# Patient Record
Sex: Female | Born: 1960 | Race: White | Hispanic: No | Marital: Single | Smoking: Never smoker
Health system: Southern US, Community
[De-identification: ages and names within clinical notes are randomized; demographics above are authoritative.]

## PROBLEM LIST (undated history)

## (undated) DIAGNOSIS — E039 Hypothyroidism, unspecified: Secondary | ICD-10-CM

## (undated) DIAGNOSIS — J45909 Unspecified asthma, uncomplicated: Secondary | ICD-10-CM

## (undated) DIAGNOSIS — K219 Gastro-esophageal reflux disease without esophagitis: Secondary | ICD-10-CM

## (undated) DIAGNOSIS — B019 Varicella without complication: Secondary | ICD-10-CM

## (undated) DIAGNOSIS — E079 Disorder of thyroid, unspecified: Secondary | ICD-10-CM

## (undated) DIAGNOSIS — E049 Nontoxic goiter, unspecified: Secondary | ICD-10-CM

## (undated) DIAGNOSIS — T7840XA Allergy, unspecified, initial encounter: Secondary | ICD-10-CM

## (undated) DIAGNOSIS — E119 Type 2 diabetes mellitus without complications: Secondary | ICD-10-CM

## (undated) DIAGNOSIS — G43909 Migraine, unspecified, not intractable, without status migrainosus: Secondary | ICD-10-CM

## (undated) DIAGNOSIS — D649 Anemia, unspecified: Secondary | ICD-10-CM

## (undated) DIAGNOSIS — R011 Cardiac murmur, unspecified: Secondary | ICD-10-CM

## (undated) DIAGNOSIS — K589 Irritable bowel syndrome without diarrhea: Secondary | ICD-10-CM

## (undated) DIAGNOSIS — I10 Essential (primary) hypertension: Secondary | ICD-10-CM

## (undated) HISTORY — PX: ENDOMETRIAL ABLATION: SHX621

## (undated) HISTORY — DX: Migraine, unspecified, not intractable, without status migrainosus: G43.909

## (undated) HISTORY — DX: Type 2 diabetes mellitus without complications: E11.9

## (undated) HISTORY — DX: Unspecified asthma, uncomplicated: J45.909

## (undated) HISTORY — PX: HAMMER TOE SURGERY: SHX385

## (undated) HISTORY — DX: Disorder of thyroid, unspecified: E07.9

## (undated) HISTORY — DX: Cardiac murmur, unspecified: R01.1

## (undated) HISTORY — DX: Gastro-esophageal reflux disease without esophagitis: K21.9

## (undated) HISTORY — DX: Allergy, unspecified, initial encounter: T78.40XA

## (undated) HISTORY — DX: Essential (primary) hypertension: I10

## (undated) HISTORY — DX: Varicella without complication: B01.9

---

## 2002-10-19 HISTORY — PX: SEPTOPLASTY: SUR1290

## 2005-04-28 ENCOUNTER — Ambulatory Visit: Payer: Self-pay | Admitting: Obstetrics and Gynecology

## 2005-12-04 ENCOUNTER — Emergency Department: Payer: Self-pay | Admitting: General Practice

## 2005-12-17 ENCOUNTER — Emergency Department: Payer: Self-pay | Admitting: Emergency Medicine

## 2006-06-09 ENCOUNTER — Emergency Department: Payer: Self-pay | Admitting: Unknown Physician Specialty

## 2006-09-01 ENCOUNTER — Ambulatory Visit: Payer: Self-pay | Admitting: Obstetrics and Gynecology

## 2006-10-13 ENCOUNTER — Ambulatory Visit: Payer: Self-pay | Admitting: Gastroenterology

## 2007-09-28 ENCOUNTER — Ambulatory Visit: Payer: Self-pay | Admitting: Obstetrics and Gynecology

## 2007-10-25 ENCOUNTER — Emergency Department: Payer: Self-pay | Admitting: Emergency Medicine
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
## 2009-11-04 ENCOUNTER — Ambulatory Visit: Payer: Self-pay | Admitting: Obstetrics and Gynecology

## 2010-11-05 ENCOUNTER — Ambulatory Visit: Payer: Self-pay | Admitting: Obstetrics and Gynecology

## 2011-03-25 ENCOUNTER — Ambulatory Visit: Payer: Self-pay | Admitting: Allergy

## 2011-11-09 ENCOUNTER — Ambulatory Visit: Payer: Self-pay | Admitting: Obstetrics and Gynecology

## 2012-02-03 DIAGNOSIS — E039 Hypothyroidism, unspecified: Secondary | ICD-10-CM | POA: Insufficient documentation

## 2012-08-30 ENCOUNTER — Emergency Department: Payer: Self-pay | Admitting: Emergency Medicine

## 2012-08-30 LAB — CBC
HCT: 38.3 % (ref 35.0–47.0)
HGB: 13.1 g/dL (ref 12.0–16.0)
MCH: 30.8 pg (ref 26.0–34.0)
MCHC: 34.1 g/dL (ref 32.0–36.0)
MCV: 90 fL (ref 80–100)
Platelet: 269 10*3/uL (ref 150–440)
RBC: 4.24 10*6/uL (ref 3.80–5.20)
WBC: 6.3 10*3/uL (ref 3.6–11.0)

## 2012-08-30 LAB — COMPREHENSIVE METABOLIC PANEL
Alkaline Phosphatase: 71 U/L (ref 50–136)
Anion Gap: 7 (ref 7–16)
Bilirubin,Total: 0.3 mg/dL (ref 0.2–1.0)
Calcium, Total: 8.7 mg/dL (ref 8.5–10.1)
Creatinine: 0.7 mg/dL (ref 0.60–1.30)
EGFR (African American): >60
Glucose: 80 mg/dL (ref 65–99)
Potassium: 3.8 mmol/L (ref 3.5–5.1)
SGPT (ALT): 20 U/L (ref 12–78)
Sodium: 140 mmol/L (ref 136–145)
Total Protein: 7.9 g/dL (ref 6.4–8.2)

## 2012-08-30 LAB — URINALYSIS, COMPLETE
Bilirubin,UR: NEGATIVE
Blood: NEGATIVE
Glucose,UR: NEGATIVE mg/dL (ref 0–75)
Leukocyte Esterase: NEGATIVE
Nitrite: NEGATIVE
Ph: 7 (ref 4.5–8.0)
RBC,UR: 2 /HPF (ref 0–5)
Specific Gravity: 1.004 (ref 1.003–1.030)
WBC UR: <1 /HPF (ref 0–5)

## 2012-11-09 ENCOUNTER — Ambulatory Visit: Payer: Self-pay | Admitting: Obstetrics and Gynecology

## 2013-11-10 ENCOUNTER — Ambulatory Visit: Payer: Self-pay | Admitting: Obstetrics and Gynecology

## 2014-07-19 ENCOUNTER — Emergency Department: Payer: Self-pay | Admitting: Emergency Medicine

## 2015-01-18 ENCOUNTER — Ambulatory Visit: Payer: Self-pay

## 2015-02-13 ENCOUNTER — Ambulatory Visit
Admit: 2015-02-13 | Disposition: A | Discharge status: Home / Self Care | Payer: Self-pay | Attending: Obstetrics and Gynecology | Admitting: Obstetrics and Gynecology

## 2016-02-07 ENCOUNTER — Ambulatory Visit: Payer: Self-pay | Admitting: Family Medicine

## 2016-02-10 ENCOUNTER — Ambulatory Visit (INDEPENDENT_AMBULATORY_CARE_PROVIDER_SITE_OTHER): Payer: BLUE CROSS/BLUE SHIELD | Admitting: Family Medicine

## 2016-02-10 ENCOUNTER — Encounter: Payer: Self-pay | Admitting: Family Medicine

## 2016-02-10 VITALS — BP 136/92 | HR 73 | Temp 98.0°F | Ht 67.0 in | Wt 209.8 lb

## 2016-02-10 DIAGNOSIS — R059 Cough, unspecified: Secondary | ICD-10-CM

## 2016-02-10 DIAGNOSIS — Z130 Encounter for screening for diseases of the blood and blood-forming organs and certain disorders involving the immune mechanism: Secondary | ICD-10-CM | POA: Diagnosis not present

## 2016-02-10 DIAGNOSIS — Z0001 Encounter for general adult medical examination with abnormal findings: Secondary | ICD-10-CM

## 2016-02-10 DIAGNOSIS — R6889 Other general symptoms and signs: Secondary | ICD-10-CM

## 2016-02-10 DIAGNOSIS — R05 Cough: Secondary | ICD-10-CM

## 2016-02-10 DIAGNOSIS — E669 Obesity, unspecified: Secondary | ICD-10-CM | POA: Diagnosis not present

## 2016-02-10 DIAGNOSIS — Z1211 Encounter for screening for malignant neoplasm of colon: Secondary | ICD-10-CM

## 2016-02-10 MED ORDER — FLUTICASONE-SALMETEROL 100-50 MCG/DOSE IN AEPB: 1.0000 | INHALATION_SPRAY | Freq: Two times a day (BID) | RESPIRATORY_TRACT | Status: DC

## 2016-02-10 NOTE — Progress Notes (Signed)
Pre visit review using our clinic review tool, if applicable. No additional management support is needed unless otherwise documented below in the visit note. 

## 2016-02-10 NOTE — Patient Instructions (Addendum)
Use the advair as directed.  We will call with your lab results and with the colonoscopy appt.  Follow up in 6 months.  Take care  Dr. Lacinda Axon   Health Maintenance, Female Adopting a healthy lifestyle and getting preventive care can go a long way to promote health and wellness. Talk with your health care provider about what schedule of regular examinations is right for you. This is a good chance for you to check in with your provider about disease prevention and staying healthy. In between checkups, there are plenty of things you can do on your own. Experts have done a lot of research about which lifestyle changes and preventive measures are most likely to keep you healthy. Ask your health care provider for more information. WEIGHT AND DIET  Eat a healthy diet  Be sure to include plenty of vegetables, fruits, low-fat dairy products, and lean protein.  Do not eat a lot of foods high in solid fats, added sugars, or salt.  Get regular exercise. This is one of the most important things you can do for your health.  Most adults should exercise for at least 150 minutes each week. The exercise should increase your heart rate and make you sweat (moderate-intensity exercise).  Most adults should also do strengthening exercises at least twice a week. This is in addition to the moderate-intensity exercise.  Maintain a healthy weight  Body mass index (BMI) is a measurement that can be used to identify possible weight problems. It estimates body fat based on height and weight. Your health care provider can help determine your BMI and help you achieve or maintain a healthy weight.  For females 19 years of age and older:   A BMI below 18.5 is considered underweight.  A BMI of 18.5 to 24.9 is normal.  A BMI of 25 to 29.9 is considered overweight.  A BMI of 30 and above is considered obese.  Watch levels of cholesterol and blood lipids  You should start having your blood tested for lipids and  cholesterol at 57 years of age, then have this test every 5 years.  You may need to have your cholesterol levels checked more often if:  Your lipid or cholesterol levels are high.  You are older than 57 years of age.  You are at high risk for heart disease.  CANCER SCREENING   Lung Cancer  Lung cancer screening is recommended for adults 55-38 years old who are at high risk for lung cancer because of a history of smoking.  A yearly low-dose CT scan of the lungs is recommended for people who:  Currently smoke.  Have quit within the past 15 years.  Have at least a 30-pack-year history of smoking. A pack year is smoking an average of one pack of cigarettes a day for 1 year.  Yearly screening should continue until it has been 15 years since you quit.  Yearly screening should stop if you develop a health problem that would prevent you from having lung cancer treatment.  Breast Cancer  Practice breast self-awareness. This means understanding how your breasts normally appear and feel.  It also means doing regular breast self-exams. Let your health care provider know about any changes, no matter how small.  If you are in your 20s or 30s, you should have a clinical breast exam (CBE) by a health care provider every 1-3 years as part of a regular health exam.  If you are 38 or older, have a CBE every  having a breast X-ray (mammogram) every year.  If you have a family history of breast cancer, talk to your health care provider about genetic screening.  If you are at high risk for breast cancer, talk to your health care provider about having an MRI and a mammogram every year.  Breast cancer gene (BRCA) assessment is recommended for women who have family members with BRCA-related cancers. BRCA-related cancers include:  Breast.  Ovarian.  Tubal.  Peritoneal cancers.  Results of the assessment will determine the need for genetic counseling and BRCA1 and BRCA2  testing. Cervical Cancer Your health care provider may recommend that you be screened regularly for cancer of the pelvic organs (ovaries, uterus, and vagina). This screening involves a pelvic examination, including checking for microscopic changes to the surface of your cervix (Pap test). You may be encouraged to have this screening done every 3 years, beginning at age 21.  For women ages 30-65, health care providers may recommend pelvic exams and Pap testing every 3 years, or they may recommend the Pap and pelvic exam, combined with testing for human papilloma virus (HPV), every 5 years. Some types of HPV increase your risk of cervical cancer. Testing for HPV may also be done on women of any age with unclear Pap test results.  Other health care providers may not recommend any screening for nonpregnant women who are considered low risk for pelvic cancer and who do not have symptoms. Ask your health care provider if a screening pelvic exam is right for you.  If you have had past treatment for cervical cancer or a condition that could lead to cancer, you need Pap tests and screening for cancer for at least 20 years after your treatment. If Pap tests have been discontinued, your risk factors (such as having a new sexual partner) need to be reassessed to determine if screening should resume. Some women have medical problems that increase the chance of getting cervical cancer. In these cases, your health care provider may recommend more frequent screening and Pap tests. Colorectal Cancer  This type of cancer can be detected and often prevented.  Routine colorectal cancer screening usually begins at 57 years of age and continues through 57 years of age.  Your health care provider may recommend screening at an earlier age if you have risk factors for colon cancer.  Your health care provider may also recommend using home test kits to check for hidden blood in the stool.  A small camera at the end of a  tube can be used to examine your colon directly (sigmoidoscopy or colonoscopy). This is done to check for the earliest forms of colorectal cancer.  Routine screening usually begins at age 50.  Direct examination of the colon should be repeated every 5-10 years through 57 years of age. However, you may need to be screened more often if early forms of precancerous polyps or small growths are found. Skin Cancer  Check your skin from head to toe regularly.  Tell your health care provider about any new moles or changes in moles, especially if there is a change in a mole's shape or color.  Also tell your health care provider if you have a mole that is larger than the size of a pencil eraser.  Always use sunscreen. Apply sunscreen liberally and repeatedly throughout the day.  Protect yourself by wearing long sleeves, pants, a wide-brimmed hat, and sunglasses whenever you are outside. HEART DISEASE, DIABETES, AND HIGH BLOOD PRESSURE   High blood   High blood pressure causes heart disease and increases the risk of stroke. High blood pressure is more likely to develop in:  People who have blood pressure in the high end of the normal range (130-139/85-89 mm Hg).  People who are overweight or obese.  People who are African American.  If you are 18-39 years of age, have your blood pressure checked every 3-5 years. If you are 40 years of age or older, have your blood pressure checked every year. You should have your blood pressure measured twice--once when you are at a hospital or clinic, and once when you are not at a hospital or clinic. Record the average of the two measurements. To check your blood pressure when you are not at a hospital or clinic, you can use:  An automated blood pressure machine at a pharmacy.  A home blood pressure monitor.  If you are between 55 years and 79 years old, ask your health care provider if you should take aspirin to prevent strokes.  Have regular diabetes screenings. This  involves taking a blood sample to check your fasting blood sugar level.  If you are at a normal weight and have a low risk for diabetes, have this test once every three years after 57 years of age.  If you are overweight and have a high risk for diabetes, consider being tested at a younger age or more often. PREVENTING INFECTION  Hepatitis B  If you have a higher risk for hepatitis B, you should be screened for this virus. You are considered at high risk for hepatitis B if:  You were born in a country where hepatitis B is common. Ask your health care provider which countries are considered high risk.  Your parents were born in a high-risk country, and you have not been immunized against hepatitis B (hepatitis B vaccine).  You have HIV or AIDS.  You use needles to inject street drugs.  You live with someone who has hepatitis B.  You have had sex with someone who has hepatitis B.  You get hemodialysis treatment.  You take certain medicines for conditions, including cancer, organ transplantation, and autoimmune conditions. Hepatitis C  Blood testing is recommended for:  Everyone born from 1945 through 1965.  Anyone with known risk factors for hepatitis C. Sexually transmitted infections (STIs)  You should be screened for sexually transmitted infections (STIs) including gonorrhea and chlamydia if:  You are sexually active and are younger than 57 years of age.  You are older than 57 years of age and your health care provider tells you that you are at risk for this type of infection.  Your sexual activity has changed since you were last screened and you are at an increased risk for chlamydia or gonorrhea. Ask your health care provider if you are at risk.  If you do not have HIV, but are at risk, it may be recommended that you take a prescription medicine daily to prevent HIV infection. This is called pre-exposure prophylaxis (PrEP). You are considered at risk if:  You are  sexually active and do not regularly use condoms or know the HIV status of your partner(s).  You take drugs by injection.  You are sexually active with a partner who has HIV. Talk with your health care provider about whether you are at high risk of being infected with HIV. If you choose to begin PrEP, you should first be tested for HIV. You should then be tested every 3 months for as   long as you are taking PrEP.  PREGNANCY   If you are premenopausal and you may become pregnant, ask your health care provider about preconception counseling.  If you may become pregnant, take 400 to 800 micrograms (mcg) of folic acid every day.  If you want to prevent pregnancy, talk to your health care provider about birth control (contraception). OSTEOPOROSIS AND MENOPAUSE   Osteoporosis is a disease in which the bones lose minerals and strength with aging. This can result in serious bone fractures. Your risk for osteoporosis can be identified using a bone density scan.  If you are 27 years of age or older, or if you are at risk for osteoporosis and fractures, ask your health care provider if you should be screened.  Ask your health care provider whether you should take a calcium or vitamin D supplement to lower your risk for osteoporosis.  Menopause may have certain physical symptoms and risks.  Hormone replacement therapy may reduce some of these symptoms and risks. Talk to your health care provider about whether hormone replacement therapy is right for you.  HOME CARE INSTRUCTIONS   Schedule regular health, dental, and eye exams.  Stay current with your immunizations.   Do not use any tobacco products including cigarettes, chewing tobacco, or electronic cigarettes.  If you are pregnant, do not drink alcohol.  If you are breastfeeding, limit how much and how often you drink alcohol.  Limit alcohol intake to no more than 1 drink per day for nonpregnant women. One drink equals 12 ounces of beer, 5  ounces of wine, or 1 ounces of hard liquor.  Do not use street drugs.  Do not share needles.  Ask your health care provider for help if you need support or information about quitting drugs.  Tell your health care provider if you often feel depressed.  Tell your health care provider if you have ever been abused or do not feel safe at home.   This information is not intended to replace advice given to you by your health care provider. Make sure you discuss any questions you have with your health care provider.   Document Released: 04/20/2011 Document Revised: 10/26/2014 Document Reviewed: 09/06/2013 Elsevier Interactive Patient Education [employer].

## 2016-02-11 DIAGNOSIS — Z0000 Encounter for general adult medical examination without abnormal findings: Secondary | ICD-10-CM | POA: Insufficient documentation

## 2016-02-11 DIAGNOSIS — R059 Cough, unspecified: Secondary | ICD-10-CM | POA: Insufficient documentation

## 2016-02-11 DIAGNOSIS — R05 Cough: Secondary | ICD-10-CM | POA: Insufficient documentation

## 2016-02-11 DIAGNOSIS — J302 Other seasonal allergic rhinitis: Secondary | ICD-10-CM | POA: Insufficient documentation

## 2016-02-11 DIAGNOSIS — J45909 Unspecified asthma, uncomplicated: Secondary | ICD-10-CM | POA: Insufficient documentation

## 2016-02-11 DIAGNOSIS — K219 Gastro-esophageal reflux disease without esophagitis: Secondary | ICD-10-CM | POA: Insufficient documentation

## 2016-02-11 DIAGNOSIS — G43909 Migraine, unspecified, not intractable, without status migrainosus: Secondary | ICD-10-CM | POA: Insufficient documentation

## 2016-02-11 LAB — LIPID PANEL
CHOL/HDL RATIO: 3
CHOLESTEROL: 209 mg/dL — AB (ref 0–200)
HDL: 73.5 mg/dL (ref >39.00)
LDL CALC: 117 mg/dL — AB (ref 0–99)
NONHDL: 135.18
Triglycerides: 89 mg/dL (ref 0.0–149.0)
VLDL: 17.8 mg/dL (ref 0.0–40.0)

## 2016-02-11 LAB — COMPREHENSIVE METABOLIC PANEL
ALBUMIN: 4.1 g/dL (ref 3.5–5.2)
ALT: 17 U/L (ref 0–35)
AST: 17 U/L (ref 0–37)
Alkaline Phosphatase: 56 U/L (ref 39–117)
BUN: 14 mg/dL (ref 6–23)
CHLORIDE: 104 meq/L (ref 96–112)
CO2: 27 mEq/L (ref 19–32)
CREATININE: 0.91 mg/dL (ref 0.40–1.20)
Calcium: 9.6 mg/dL (ref 8.4–10.5)
GFR: 67.82 mL/min (ref >60.00)
GLUCOSE: 89 mg/dL (ref 70–99)
POTASSIUM: 4.3 meq/L (ref 3.5–5.1)
Sodium: 140 mEq/L (ref 135–145)
Total Bilirubin: 0.4 mg/dL (ref 0.2–1.2)
Total Protein: 7.3 g/dL (ref 6.0–8.3)

## 2016-02-11 LAB — CBC
HEMATOCRIT: 36.7 % (ref 36.0–46.0)
HEMOGLOBIN: 12.4 g/dL (ref 12.0–15.0)
MCHC: 33.8 g/dL (ref 30.0–36.0)
MCV: 89.3 fl (ref 78.0–100.0)
Platelets: 264 10*3/uL (ref 150.0–400.0)
RBC: 4.11 Mil/uL (ref 3.87–5.11)
RDW: 12.9 % (ref 11.5–15.5)
WBC: 9.3 10*3/uL (ref 4.0–10.5)

## 2016-02-11 LAB — HEMOGLOBIN A1C: HEMOGLOBIN A1C: 7.4 % — AB (ref 4.6–6.5)

## 2016-02-11 MED ORDER — ALBUTEROL SULFATE HFA 108 (90 BASE) MCG/ACT IN AERS: 2.0000 | INHALATION_SPRAY | Freq: Four times a day (QID) | RESPIRATORY_TRACT | Status: AC | PRN

## 2016-02-11 NOTE — Assessment & Plan Note (Signed)
Pap smear up-to-date. Patient to get mammogram later this year. Tdap up today. Patient to consider pneumococcal and zostavax vaccines. Screening labs today.

## 2016-02-11 NOTE — Progress Notes (Addendum)
Subjective:  Patient ID: Robin Munoz, female    DOB: 11-09-1960  Age: 55 y.o. MRN: [id number]  CC: Establish care, cough  HPI Robin Munoz is a 55 y.o. female presents to the clinic today to establish care.  Patient also has complaints of cough.  Preventative Healthcare  Pap smear: 11/2014.  Mammogram: 12/2014. Due for mammogram.  Colonoscopy: In need of. Will place order.   Immunizations  Tetanus - 2015.   Pneumococcal - Discussed briefly today. Patient to consider.  Flu - N/A.  Zoster - discussed today. Patient to consider.  Labs: Labs today.  Exercise: No.  Alcohol use: See below.  Smoking/tobacco use: No.  Cough  Has been persistent since earlier this month.  He states that she been treated twice. The first time with doxycycline. In the second with prednisone/albuterol.  She reports her cough continues to persist.  She states it is intermittently productive.  Worse in the morning and at night.  Using albuterol frequently.  No known exacerbating or relieving factors.  No associated fevers or chills. She has had some shortness of breath.  Patient has a known history of asthma.  PMH, Surgical Hx, Family Hx, Social History reviewed and updated as below.  Past Medical History  Diagnosis Date  . Chicken pox   . Allergy   . Asthma   . Migraines   . Thyroid disease   . Hypertension   . Heart murmur   . GERD (gastroesophageal reflux disease)    Past Surgical History  Procedure Laterality Date  . Endometrial ablation    . Septoplasty  2004  . Hammer toe surgery     Family History  Problem Relation Age of Onset  . Heart disease Brother   . Hypertension Brother   . Colon cancer Maternal Aunt   . Colon cancer Cousin   . Heart disease Brother   . Stroke Maternal Aunt   . Diabetes Maternal Grandfather    Social History  Substance Use Topics  . Smoking status: Never Smoker   . Smokeless tobacco: Never Used  . Alcohol Use: 0.6 - 1.2  oz/week    1-2 Standard drinks or equivalent per week    Review of Systems  HENT: Positive for voice change.   Respiratory: Positive for cough and shortness of breath.   Neurological: Positive for headaches.  All other systems reviewed and are negative.  Objective:   Today's Vitals: BP 136/92 mmHg  Pulse 73  Temp(Src) 98 F (36.7 C) (Oral)  Ht  (1.702 m)  Wt 209 lb 12 oz (95.142 kg)  BMI 32.84 kg/m2  SpO2 97%  Physical Exam  Constitutional: She is oriented to person, place, and time. She appears well-developed and well-nourished. No distress.  HENT:  Head: Normocephalic and atraumatic.  Mouth/Throat: Oropharynx is clear and moist. No oropharyngeal exudate.  Normal TM's bilaterally.   Eyes: Conjunctivae are normal. No scleral icterus.  Neck: Neck supple. No thyromegaly present.  Cardiovascular: Normal rate and regular rhythm.   No murmur heard. Pulmonary/Chest: Effort normal and breath sounds normal. She has no wheezes. She has no rales.  Abdominal: Soft. She exhibits no distension. There is no tenderness. There is no rebound and no guarding.  Musculoskeletal: Normal range of motion. She exhibits no edema.  Lymphadenopathy:    She has no cervical adenopathy.  Neurological: She is alert and oriented to person, place, and time.  Skin: Skin is warm and dry. No rash noted.  Psychiatric: She has  a normal mood and affect.  Vitals reviewed.  Assessment & Plan:   Problem List Items Addressed This Visit    Cough    New problem.  Likely postviral vs secondary to asthma. Starting Advair.      Relevant Medications   albuterol (PROVENTIL HFA;VENTOLIN HFA) 108 (90 Base) MCG/ACT inhaler   Encounter for general adult medical examination with abnormal findings - Primary    Pap smear up-to-date. Patient to get mammogram later this year. Tdap up today. Patient to consider pneumococcal and zostavax vaccines. Screening labs today.      Relevant Orders   Ambulatory referral  to Gastroenterology    Other Visit Diagnoses    Screening for deficiency anemia        Relevant Orders    CBC (Completed)    Ambulatory referral to Gastroenterology    Obesity (BMI 30.0-34.9)        Relevant Orders    Comprehensive metabolic panel (Completed)    Lipid Profile (Completed)    HgB A1c (Completed)    Ambulatory referral to Gastroenterology    Encounter for screening colonoscopy        Relevant Orders    Ambulatory referral to Gastroenterology       Outpatient Encounter Prescriptions as of 02/10/2016  Medication Sig  . fexofenadine (ALLEGRA ALLERGY) 180 MG tablet Take by mouth.  . levothyroxine (SYNTHROID, LEVOTHROID) 112 MCG tablet Take 112 mcg by mouth daily. One tablet once a day  . albuterol (PROVENTIL HFA;VENTOLIN HFA) 108 (90 Base) MCG/ACT inhaler Inhale 2 puffs into the lungs every 6 (six) hours as needed for wheezing or shortness of breath.  . Fluticasone-Salmeterol (ADVAIR) 100-50 MCG/DOSE AEPB Inhale 1 puff into the lungs 2 (two) times daily.   No facility-administered encounter medications on file as of 02/10/2016.   Follow-up: 6 months.  Everlene OtherJayce Maria Gallicchio DO [facility] [place]

## 2016-02-11 NOTE — Assessment & Plan Note (Signed)
Pap smear up-to-date. Patient to get mammogram later this year. Tdap up today. Patient to consider pneumococcal and zostavax vaccines. Screening labs today. 

## 2016-02-11 NOTE — Assessment & Plan Note (Signed)
New problem.  Likely postviral vs secondary to asthma. Starting Advair.

## 2016-02-12 ENCOUNTER — Telehealth: Payer: Self-pay

## 2016-02-12 NOTE — Telephone Encounter (Signed)
Patient's insurance doesn't cover advair disku, please advise for change in med or prior authorization.  Thanks

## 2016-02-13 ENCOUNTER — Other Ambulatory Visit: Payer: Self-pay | Admitting: Family Medicine

## 2016-02-13 MED ORDER — BECLOMETHASONE DIPROPIONATE 80 MCG/ACT IN AERS: 1.0000 | INHALATION_SPRAY | Freq: Two times a day (BID) | RESPIRATORY_TRACT | Status: AC

## 2016-02-13 NOTE — Telephone Encounter (Signed)
Rx sent 

## 2016-02-14 ENCOUNTER — Ambulatory Visit (INDEPENDENT_AMBULATORY_CARE_PROVIDER_SITE_OTHER): Payer: BLUE CROSS/BLUE SHIELD | Admitting: Family Medicine

## 2016-02-14 ENCOUNTER — Encounter: Payer: Self-pay | Admitting: Family Medicine

## 2016-02-14 VITALS — BP 118/84 | HR 80 | Temp 98.3°F | Wt 208.5 lb

## 2016-02-14 DIAGNOSIS — E119 Type 2 diabetes mellitus without complications: Secondary | ICD-10-CM

## 2016-02-14 NOTE — Patient Instructions (Signed)
Follow up in 3 months.  We will call with the appt.  Take care  Dr. Adriana Simasook

## 2016-02-15 LAB — MICROALBUMIN / CREATININE URINE RATIO
Creatinine, Urine: 170 mg/dL (ref 20–320)
MICROALB/CREAT RATIO: 3 ug/mg{creat} (ref <30)
Microalb, Ur: 0.5 mg/dL

## 2016-02-16 DIAGNOSIS — E119 Type 2 diabetes mellitus without complications: Secondary | ICD-10-CM | POA: Insufficient documentation

## 2016-02-16 NOTE — Progress Notes (Signed)
   Subjective:  Patient ID: Robin Munoz, female    DOB: [birth year]  Age: 55 y.o. MRN: [id number]  CC: Follow up; new diagnosis of DM  HPI:  55 year old female presents to discuss new diagnosis of DM and management.  DM  New diagnosis.  Patient currently asymptomatic.  Discovered incidentally.   Patient presents to discuss treatment options. Preventative care  Eye exam - In need of later this year.  Foot exam - Will perform today.  Last A1C - Recent A1C was 7.4 confirming DM.  Urine microalbumin - Will obtain today.  Social Hx   Social History   Social History  . Marital Status: Single    Spouse Name: N/A  . Number of Children: N/A  . Years of Education: N/A   Social History Main Topics  . Smoking status: Never Smoker   . Smokeless tobacco: Never Used  . Alcohol Use: 0.6 - 1.2 oz/week    1-2 Standard drinks or equivalent per week  . Drug Use: No  . Sexual Activity:    Partners: Male   Other Topics Concern  . None   Social History Narrative   Review of Systems  Constitutional: Negative.   Endocrine: Negative.    Objective:  BP 118/84 mmHg  Pulse 80  Temp(Src) 98.3 F (36.8 C) (Oral)  Wt 208 lb 8 oz (94.575 kg)  SpO2 97%  BP/Weight 02/14/2016 02/10/2016  Systolic BP 118 136  Diastolic BP 84 92  Wt. (Lbs) 208.5 209.75  BMI 32.65 32.84   Physical Exam  Constitutional: She is oriented to person, place, and time. She appears well-developed. No distress.  Cardiovascular: Normal rate and regular rhythm.   Murmur heard. Pulmonary/Chest: Effort normal and breath sounds normal.  Neurological: She is alert and oriented to person, place, and time.  Psychiatric: She has a normal mood and affect.    Lab Results  Component Value Date   WBC 9.3 02/11/2016   HGB 12.4 02/11/2016   HCT 36.7 02/11/2016   PLT 264.0 02/11/2016   GLUCOSE 89 02/11/2016   CHOL 209* 02/11/2016   TRIG 89.0 02/11/2016   HDL 73.50 02/11/2016   LDLCALC 117* 02/11/2016   ALT  17 02/11/2016   AST 17 02/11/2016   NA 140 02/11/2016   K 4.3 02/11/2016   CL 104 02/11/2016   CREATININE 0.91 02/11/2016   BUN 14 02/11/2016   CO2 27 02/11/2016   HGBA1C 7.4* 02/11/2016   MICROALBUR 0.5 02/14/2016    Assessment & Plan:   Problem List Items Addressed This Visit    DM type 2 (diabetes mellitus, type 2) (HCC) - Primary    New diagnosis. Discussed treatment options today. Patient would like to try lifestyle changes prior to starting medication. Sending to diabetic education. Foot exam performed today. Urine microalbumin today. Advised to get eye exam.      Relevant Orders   Urine Microalbumin w/creat. ratio (Completed)     Follow-up: Return in about 3 months (around 05/15/2016).  20 minutes were spent face-to-face with the patient during this encounter and over half of that time was spent on counseling regarding new diagnosis of DM-2 as well as treatment options/approaches.   Everlene OtherJayce Eldoris Beiser DO [facility] [place]

## 2016-02-16 NOTE — Assessment & Plan Note (Addendum)
New diagnosis. Discussed treatment options today. Patient would like to try lifestyle changes prior to starting medication. Sending to diabetic education. Foot exam performed today. Urine microalbumin today. Advised to get eye exam.

## 2016-02-18 ENCOUNTER — Telehealth: Payer: Self-pay | Admitting: Family Medicine

## 2016-02-18 NOTE — Telephone Encounter (Signed)
Caller name: Robin Munoz Relationship to patient: patient Can be reached: [phone]  Reason for call: pt called requesting a referral to the [facility].

## 2016-02-20 ENCOUNTER — Encounter: Payer: BLUE CROSS/BLUE SHIELD | Attending: Family Medicine | Admitting: *Deleted

## 2016-02-20 ENCOUNTER — Encounter: Payer: Self-pay | Admitting: *Deleted

## 2016-02-20 ENCOUNTER — Other Ambulatory Visit: Payer: Self-pay | Admitting: Family Medicine

## 2016-02-20 VITALS — BP 124/80 | Ht 68.0 in | Wt 206.9 lb

## 2016-02-20 DIAGNOSIS — E119 Type 2 diabetes mellitus without complications: Secondary | ICD-10-CM

## 2016-02-20 NOTE — Patient Instructions (Addendum)
Check blood sugars 1 x day before breakfast or 2 hrs after supper every day Exercise: Begin walking for  15 minutes 3  days a week and gradually increase to 150 minutes/week Eat 3 meals day,  1-2  snacks a day Space meals 4-6 hours apart Bring blood sugar records to the next class Call your doctor for a prescription for:  1. Meter strips (type) One Touch Verio  checking  1   time per day  2. Lancets (type) One Touch Delica   checking  1   time per day

## 2016-02-21 NOTE — Progress Notes (Signed)
Diabetes Self-Management Education  Visit Type: First/Initial  Appt. Start Time: 0605 Appt. End Time: 1715  02/21/2016  Ms. Robin Munoz, identified by name and date of birth, is a 55 y.o. female with a diagnosis of Diabetes: Type 2.   ASSESSMENT  Blood pressure 124/80, height 5\' 8"  (1.727 m), weight 206 lb 14.4 oz (93.849 kg). Body mass index is 31.47 kg/(m^2).      Diabetes Self-Management Education - 02/20/16 1753    Visit Information   Visit Type First/Initial   Initial Visit   Diabetes Type Type 2   Are you currently following a meal plan? Yes   What type of meal plan do you follow? reduced carbohydrates   Are you taking your medications as prescribed? Yes   Date Diagnosed February 11, 2016   Health Coping   How would you rate your overall health? Fair   Psychosocial Assessment   Patient Belief/Attitude about Diabetes Motivated to manage diabetes  "worried"   Self-care barriers None   Self-management support Doctor's office   Patient Concerns Nutrition/Meal planning;Glycemic Control;Problem Solving;Weight Control;Healthy Lifestyle;Medication   Special Needs None   Preferred Learning Style Auditory;Visual;Hands on   Learning Readiness Change in progress   How often do you need to have someone help you when you read instructions, pamphlets, or other written materials from your doctor or pharmacy? 1 - Never   What is the last grade level you completed in school? BS   Pre-Education Assessment   Patient understands the diabetes disease and treatment process. Needs Instruction   Patient understands incorporating nutritional management into lifestyle. Needs Instruction   Patient undertands incorporating physical activity into lifestyle. Needs Instruction   Patient understands using medications safely. Needs Instruction   Patient understands monitoring blood glucose, interpreting and using results Needs Instruction   Patient understands prevention, detection, and treatment of  acute complications. Needs Instruction   Patient understands prevention, detection, and treatment of chronic complications. Needs Instruction   Patient understands how to develop strategies to address psychosocial issues. Needs Instruction   Patient understands how to develop strategies to promote health/change behavior. Needs Instruction   Complications   Last HgB A1C per patient/outside source 7.4 %  02/11/16   How often do you check your blood sugar? 0 times/day (not testing)  Provided One Touch Verio Flex meter and instructed on use. BG upon return demonstrationw as 77 mg/dL at 1:615:05 pm - 6 1/2 hrs pp.   Have you had a dilated eye exam in the past 12 months? Yes   Have you had a dental exam in the past 12 months? Yes   Are you checking your feet? No   Dietary Intake   Breakfast peanut butter crackers   Lunch foods from deli at work; [employer]   Snack (afternoon) [employer]   Dinner grilled meat and vegetables - salad, green beans   Snack (evening) popcorn   Beverage(s) water   Exercise   Exercise Type ADL's   Patient Education   Previous Diabetes Education No   Disease state  Definition of diabetes, type 1 and 2, and the diagnosis of diabetes   Nutrition management  Role of diet in the treatment of diabetes and the relationship between the three main macronutrients and blood glucose level   Physical activity and exercise  Role of exercise on diabetes management, blood pressure control and cardiac health.   Monitoring Taught/evaluated SMBG meter.;Purpose and frequency of SMBG.;Identified appropriate SMBG and/or A1C goals.   Chronic complications Relationship between chronic  complications and blood glucose control   Psychosocial adjustment Identified and addressed patients feelings and concerns about diabetes   Individualized Goals (developed by patient)   Reducing Risk Improve blood sugars Prevent diabetes complications Lose weight Lead a healthier lifestyle Become more fit Prevent taking  diabetes medications for now   Outcomes   Expected Outcomes Demonstrated interest in learning. Expect positive outcomes      Individualized Plan for Diabetes Self-Management Training:   Learning Objective:  Patient will have a greater understanding of diabetes self-management. Patient education plan is to attend individual and/or group sessions per assessed needs and concerns.   Plan:   Patient Instructions  Check blood sugars 1 x day before breakfast or 2 hrs after supper every day Exercise: Begin walking for  15 minutes 3  days a week and gradually increase to 150 minutes/week Eat 3 meals day,  1-2  snacks a day Space meals 4-6 hours apart Bring blood sugar records to the next class Call your doctor for a prescription for:  1. Meter strips (type) One Touch Verio  checking  1   time per day  2. Lancets (type) One Touch Delica   checking  1   time per day   Expected Outcomes:  Demonstrated interest in learning. Expect positive outcomes  Education material provided:  General Meal Planning Guidelines Simple Meal Plan Meter - One Touch Verio Flex  If problems or questions, patient to contact team via:   Sharion Settler, RN, CCM, CDE [phone]  Future DSME appointment:  Monday Feb 24, 2016 for Diabetes Class 1

## 2016-02-24 ENCOUNTER — Encounter: Payer: BLUE CROSS/BLUE SHIELD | Admitting: Dietician

## 2016-02-24 ENCOUNTER — Encounter: Payer: Self-pay | Admitting: Dietician

## 2016-02-24 VITALS — Ht 67.0 in | Wt 204.3 lb

## 2016-02-24 DIAGNOSIS — E119 Type 2 diabetes mellitus without complications: Secondary | ICD-10-CM | POA: Diagnosis not present

## 2016-02-24 NOTE — Progress Notes (Signed)

## 2016-03-02 ENCOUNTER — Encounter: Payer: Self-pay | Admitting: Dietician

## 2016-03-02 ENCOUNTER — Encounter: Payer: BLUE CROSS/BLUE SHIELD | Admitting: Dietician

## 2016-03-02 VITALS — Wt 202.2 lb

## 2016-03-02 DIAGNOSIS — E119 Type 2 diabetes mellitus without complications: Secondary | ICD-10-CM | POA: Diagnosis not present

## 2016-03-02 NOTE — Progress Notes (Signed)

## 2016-03-06 ENCOUNTER — Other Ambulatory Visit: Payer: Self-pay

## 2016-03-06 MED ORDER — ONETOUCH LANCETS MISC: Status: AC

## 2016-03-06 MED ORDER — GLUCOSE BLOOD VI STRP: ORAL_STRIP | Status: AC

## 2016-03-09 ENCOUNTER — Encounter: Payer: Self-pay | Admitting: Dietician

## 2016-03-09 ENCOUNTER — Ambulatory Visit: Payer: Self-pay | Admitting: Family Medicine

## 2016-03-09 ENCOUNTER — Encounter: Payer: BLUE CROSS/BLUE SHIELD | Admitting: Dietician

## 2016-03-09 VITALS — BP 129/80 | Ht 67.0 in | Wt 203.0 lb

## 2016-03-09 DIAGNOSIS — E119 Type 2 diabetes mellitus without complications: Secondary | ICD-10-CM | POA: Diagnosis not present

## 2016-03-09 NOTE — Progress Notes (Signed)
Appt. Start Time: 1730Appt. End Time: 1030  Class 3 Psychosocial - identify DM as a source of stress; state the effects of stress on BG control; verbalize appropriate stress management techniques; identify personal stress issues   Nutritional Management - describe effects of food on blood glucose; identify sources of carbohydrate, protein and fat; verbalize the importance of balance meals in controlling blood glucose; identify meals as well balanced or not; estimate servings of carbohydrate from menus; use food labels to identify servings size, content of carbohydrate, fiber, protein, fat, saturated fat and sodium; recognize food sources of fat, saturated fat, trans fat, sodium and verbalize goals for intake; describe healthful appropriate food choices when dining out   Exercise - describe the effects of exercise on blood glucose and importance of regular exercise in controlling diabetes; state a plan for personal exercise; verbalize contraindications for exercise  Self-Monitoring - state importance of HBGM and demo procedure accurately; use HBGM results to effectively manage diabetes; identify importance of regular HbA1C testing and goals for results  Acute Complications/Sick Day Guidelines - recognize hyperglycemia and hypoglycemia with causes and effects; identify blood glucose results as high, low or in control; list steps in treating and preventing high and low blood glucose; state appropriate measure to manage blood glucose when ill (need for meds, HBGM plan, when to call physician, need for fluids)  Chronic Complications/Foot, Skin, Eye Dental Care - identify possible long-term complications of diabetes (retinopathy, neuropathy, nephropathy, cardiovascular disease, infections); explain steps in prevention and treatment of chronic complications; state importance of daily self-foot exams; describe how to examine feet and what to look for; explain appropriate eye and dental care  Lifestyle  Changes/Goals & Health/Community Resources - state benefits of making appropriate lifestyle changes; identify habits that need to change (meals, tobacco, alcohol); identify strategies to reduce risk factors for personal health; set goals for proper diabetes care; state need for and frequency of healthcare follow-up; describe appropriate community resources for good health (ADA, web sites, apps)   Teaching Materials Used: Class 3 Slide Packet Diabetes Stress Test Stress Management Tools Stress Poem Recipe/Menu Booklet Nutrition Prescription Fast Food Information Goal Setting Worksheet    

## 2016-03-17 ENCOUNTER — Encounter: Payer: Self-pay | Admitting: *Deleted

## 2016-04-17 ENCOUNTER — Encounter: Payer: Self-pay | Admitting: Family Medicine

## 2016-04-17 ENCOUNTER — Other Ambulatory Visit: Payer: Self-pay | Admitting: Obstetrics and Gynecology

## 2016-04-17 DIAGNOSIS — Z1231 Encounter for screening mammogram for malignant neoplasm of breast: Secondary | ICD-10-CM

## 2016-05-04 ENCOUNTER — Ambulatory Visit
Admission: RE | Admit: 2016-05-04 | Discharge: 2016-05-04 | Disposition: A | Discharge status: Home / Self Care | Payer: BLUE CROSS/BLUE SHIELD | Source: Ambulatory Visit | Attending: Obstetrics and Gynecology | Admitting: Obstetrics and Gynecology

## 2016-05-04 ENCOUNTER — Other Ambulatory Visit: Payer: Self-pay | Admitting: Obstetrics and Gynecology

## 2016-05-04 DIAGNOSIS — Z1231 Encounter for screening mammogram for malignant neoplasm of breast: Secondary | ICD-10-CM

## 2016-05-19 ENCOUNTER — Encounter: Payer: Self-pay | Admitting: Family Medicine

## 2016-05-19 ENCOUNTER — Ambulatory Visit (INDEPENDENT_AMBULATORY_CARE_PROVIDER_SITE_OTHER): Payer: BLUE CROSS/BLUE SHIELD | Admitting: Family Medicine

## 2016-05-19 VITALS — BP 144/92 | HR 70 | Temp 97.9°F | Wt 198.0 lb

## 2016-05-19 DIAGNOSIS — B353 Tinea pedis: Secondary | ICD-10-CM | POA: Diagnosis not present

## 2016-05-19 DIAGNOSIS — E119 Type 2 diabetes mellitus without complications: Secondary | ICD-10-CM | POA: Diagnosis not present

## 2016-05-19 MED ORDER — CICLOPIROX OLAMINE 0.77 % EX CREA: TOPICAL_CREAM | Freq: Two times a day (BID) | CUTANEOUS | 0 refills | Status: AC

## 2016-05-19 NOTE — Progress Notes (Signed)
Subjective:  Patient ID: Robin Munoz, female    DOB: December 11, 1960  Age: 55 y.o. MRN: [id number]  CC: Follow up  HPI:  55 year old female with DM-2 , Asthma, Hypothyroidism presents for follow up regarding her DM-2. She also has an acute issue as well (see below).  DM  Blood sugars readings - Max 108-110; Has made dietary changes.  Medications - None at this time. Patient wanted trial of lifestyle changes.  Preventative care  Eye exam - In need of later this year.   Foot exam - Up to date.  Last A1C - A1C today.  Urine microalbumin - Up to date.  Concern for Athlete's foot  Patient reports some skin changes/peeling between the 3rd and 4th toes of her left foot.  This is been going on for quite some time.  She's been applying over-the-counter topical antifungal spray with some improvement but no resolution.  Exacerbated by moisture.  No other associated symptoms.  No other complaints this time.  Social Hx   Social History   Social History  . Marital status: Single    Spouse name: N/A  . Number of children: N/A  . Years of education: N/A   Social History Main Topics  . Smoking status: Never Smoker  . Smokeless tobacco: Never Used  . Alcohol use 0.6 oz/week    1 Glasses of wine per week  . Drug use: No  . Sexual activity: Yes    Partners: Male   Other Topics Concern  . None   Social History Narrative  . None   Review of Systems  Constitutional: Negative.   Endocrine: Negative.   Skin:       Athletes foot.   Objective:  BP (!) 144/92 (BP Location: Right Arm, Patient Position: Sitting, Cuff Size: Normal)   Pulse 70   Temp 97.9 F (36.6 C) (Oral)   Wt 198 lb (89.8 kg)   SpO2 98%   BMI 31.01 kg/m   BP/Weight 05/19/2016 03/09/2016 03/02/2016  Systolic BP 144 129 -  Diastolic BP 92 80 -  Wt. (Lbs) 198 203 202.2  BMI 31.01 31.79 31.66   Physical Exam  Constitutional: She is oriented to person, place, and time. She appears well-developed. No  distress.  Pulmonary/Chest: Effort normal.  Neurological: She is alert and oriented to person, place, and time.  Skin:  Left foot - Interdigital region (3rd and 4th toes) with peeling/maceration.   Psychiatric: She has a normal mood and affect.  Vitals reviewed.  Lab Results  Component Value Date   WBC 9.3 02/11/2016   HGB 12.4 02/11/2016   HCT 36.7 02/11/2016   PLT 264.0 02/11/2016   GLUCOSE 89 02/11/2016   CHOL 209 (H) 02/11/2016   TRIG 89.0 02/11/2016   HDL 73.50 02/11/2016   LDLCALC 117 (H) 02/11/2016   ALT 17 02/11/2016   AST 17 02/11/2016   NA 140 02/11/2016   K 4.3 02/11/2016   CL 104 02/11/2016   CREATININE 0.91 02/11/2016   BUN 14 02/11/2016   CO2 27 02/11/2016   HGBA1C 7.4 (H) 02/11/2016   MICROALBUR 0.5 02/14/2016    Assessment & Plan:   Problem List Items Addressed This Visit    DM type 2 (diabetes mellitus, type 2) (HCC) - Primary    Established problem, improving. A1C today.      Relevant Orders   HgB A1c   Tinea pedis    New problem. Failed OTC treatment. Treating with Loprox.  Relevant Medications   ciclopirox (LOPROX) 0.77 % cream    Other Visit Diagnoses   None.     Meds ordered this encounter  Medications  . ciclopirox (LOPROX) 0.77 % cream    Sig: Apply topically 2 (two) times daily.    Dispense:  90 g    Refill:  0    Follow-up: TBD  Robin Munoz Other DO [facility]

## 2016-05-19 NOTE — Progress Notes (Signed)
Pre visit review using our clinic review tool, if applicable. No additional management support is needed unless otherwise documented below in the visit note. 

## 2016-05-19 NOTE — Assessment & Plan Note (Signed)
New problem. Failed OTC treatment. Treating with Loprox.

## 2016-05-19 NOTE — Assessment & Plan Note (Signed)
Established problem, improving. A1C today.

## 2016-05-19 NOTE — Patient Instructions (Signed)
We will call with your lab results.  Follow up to be determine based on A1C.  Take care  Dr. Adriana Simas

## 2016-05-20 LAB — HEMOGLOBIN A1C: HEMOGLOBIN A1C: 6.9 % — AB (ref 4.6–6.5)

## 2017-04-23 ENCOUNTER — Other Ambulatory Visit: Payer: Self-pay | Admitting: Obstetrics and Gynecology

## 2017-04-23 DIAGNOSIS — Z1231 Encounter for screening mammogram for malignant neoplasm of breast: Secondary | ICD-10-CM

## 2017-05-18 ENCOUNTER — Ambulatory Visit
Admission: RE | Admit: 2017-05-18 | Discharge: 2017-05-18 | Disposition: A | Discharge status: Home / Self Care | Payer: BLUE CROSS/BLUE SHIELD | Source: Ambulatory Visit | Attending: Obstetrics and Gynecology | Admitting: Obstetrics and Gynecology

## 2017-05-18 DIAGNOSIS — Z1231 Encounter for screening mammogram for malignant neoplasm of breast: Secondary | ICD-10-CM | POA: Insufficient documentation

## 2018-04-28 ENCOUNTER — Other Ambulatory Visit: Payer: Self-pay | Admitting: Obstetrics and Gynecology

## 2018-04-28 DIAGNOSIS — Z1231 Encounter for screening mammogram for malignant neoplasm of breast: Secondary | ICD-10-CM

## 2018-06-07 ENCOUNTER — Ambulatory Visit
Admission: RE | Admit: 2018-06-07 | Discharge: 2018-06-07 | Disposition: A | Discharge status: Home / Self Care | Payer: BLUE CROSS/BLUE SHIELD | Source: Ambulatory Visit | Attending: Obstetrics and Gynecology | Admitting: Obstetrics and Gynecology

## 2018-06-07 DIAGNOSIS — Z1231 Encounter for screening mammogram for malignant neoplasm of breast: Secondary | ICD-10-CM

## 2018-11-18 ENCOUNTER — Ambulatory Visit: Payer: Self-pay | Admitting: Urology

## 2019-06-21 ENCOUNTER — Other Ambulatory Visit: Payer: Self-pay | Admitting: Obstetrics and Gynecology

## 2019-06-21 DIAGNOSIS — Z1231 Encounter for screening mammogram for malignant neoplasm of breast: Secondary | ICD-10-CM

## 2019-07-26 ENCOUNTER — Ambulatory Visit
Admission: RE | Admit: 2019-07-26 | Discharge: 2019-07-26 | Disposition: A | Discharge status: Home / Self Care | Payer: BC Managed Care – PPO | Source: Ambulatory Visit | Attending: Obstetrics and Gynecology | Admitting: Obstetrics and Gynecology

## 2019-07-26 DIAGNOSIS — Z1231 Encounter for screening mammogram for malignant neoplasm of breast: Secondary | ICD-10-CM | POA: Diagnosis present

## 2020-07-11 ENCOUNTER — Other Ambulatory Visit: Payer: Self-pay | Admitting: Obstetrics and Gynecology

## 2020-07-11 DIAGNOSIS — Z1231 Encounter for screening mammogram for malignant neoplasm of breast: Secondary | ICD-10-CM

## 2020-08-15 ENCOUNTER — Ambulatory Visit
Admission: RE | Admit: 2020-08-15 | Discharge: 2020-08-15 | Disposition: A | Discharge status: Home / Self Care | Payer: BC Managed Care – PPO | Source: Ambulatory Visit | Attending: Obstetrics and Gynecology | Admitting: Obstetrics and Gynecology

## 2020-08-15 ENCOUNTER — Other Ambulatory Visit: Payer: Self-pay

## 2020-08-15 DIAGNOSIS — Z1231 Encounter for screening mammogram for malignant neoplasm of breast: Secondary | ICD-10-CM

## 2020-10-19 HISTORY — PX: OTHER SURGICAL HISTORY: SHX169

## 2021-07-17 ENCOUNTER — Other Ambulatory Visit: Payer: Self-pay | Admitting: Obstetrics and Gynecology

## 2021-07-17 DIAGNOSIS — Z1231 Encounter for screening mammogram for malignant neoplasm of breast: Secondary | ICD-10-CM

## 2021-08-18 ENCOUNTER — Other Ambulatory Visit: Payer: Self-pay

## 2021-08-18 ENCOUNTER — Ambulatory Visit
Admission: RE | Admit: 2021-08-18 | Discharge: 2021-08-18 | Disposition: A | Discharge status: Home / Self Care | Payer: BC Managed Care – PPO | Source: Ambulatory Visit | Attending: Obstetrics and Gynecology | Admitting: Obstetrics and Gynecology

## 2021-08-18 DIAGNOSIS — Z1231 Encounter for screening mammogram for malignant neoplasm of breast: Secondary | ICD-10-CM | POA: Insufficient documentation

## 2022-03-05 IMAGING — MG MM DIGITAL SCREENING BILAT W/ TOMO AND CAD
8 series · 8 of 24 positions shown · non-contrast
Comparison: Previous exam(s).

CLINICAL DATA: Screening.

EXAM:
DIGITAL SCREENING BILATERAL MAMMOGRAM WITH TOMOSYNTHESIS AND CAD
TECHNIQUE: Bilateral screening digital craniocaudal and mediolateral oblique
mammograms were obtained. Bilateral screening digital breast
tomosynthesis was performed. The images were evaluated with
computer-aided detection.

[L MLO synth-2D]
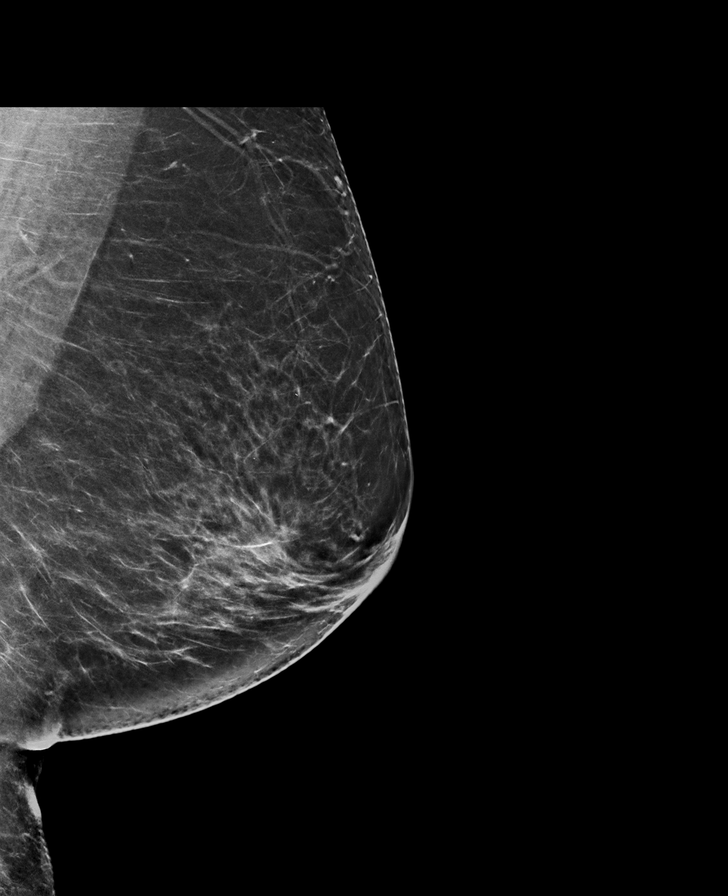

[R MLO synth-2D]
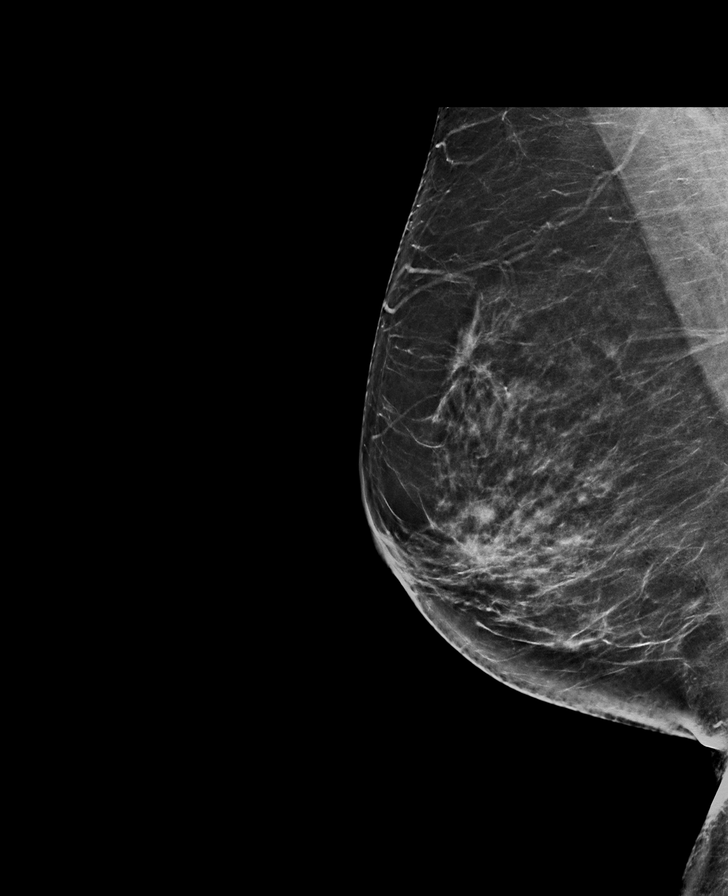

[L CC synth-2D]
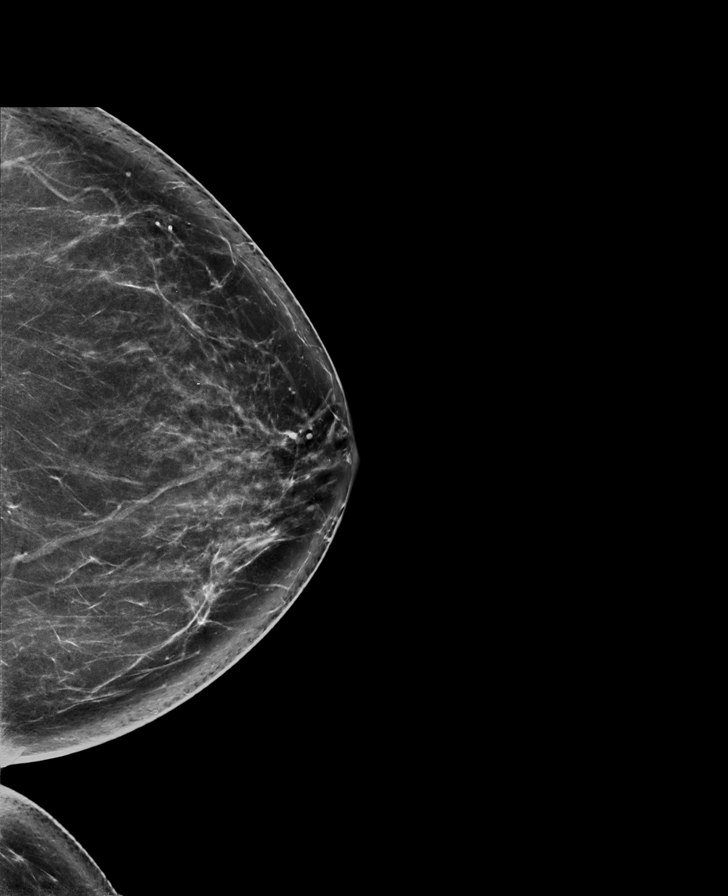

[R CC synth-2D]
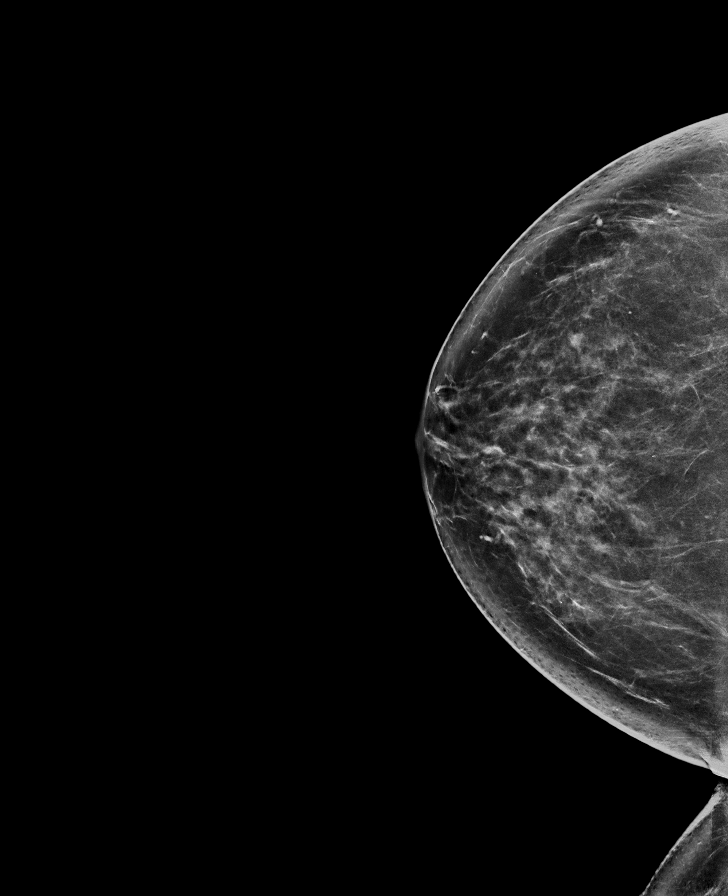

[R MLO tomo · tomo slice 42/83.0]
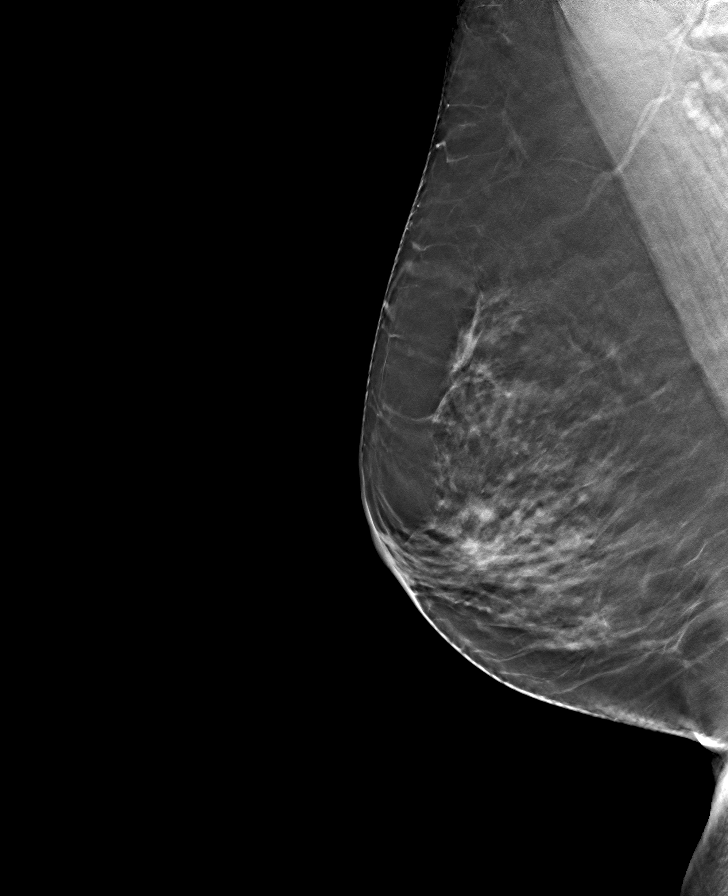

[R CC tomo · tomo slice 42/83.0]
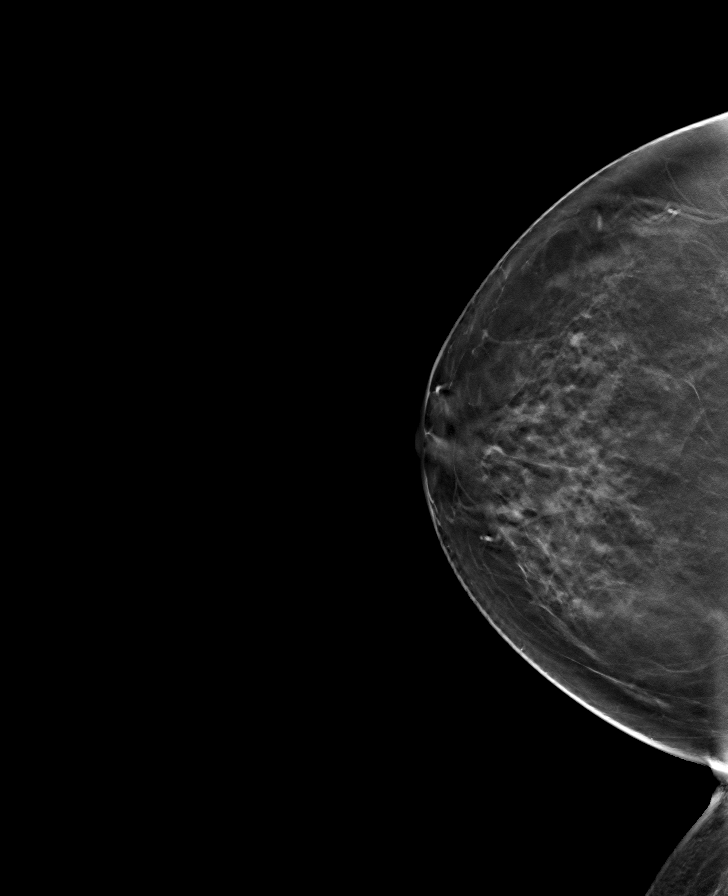

[L CC tomo · tomo slice 43/85.0]
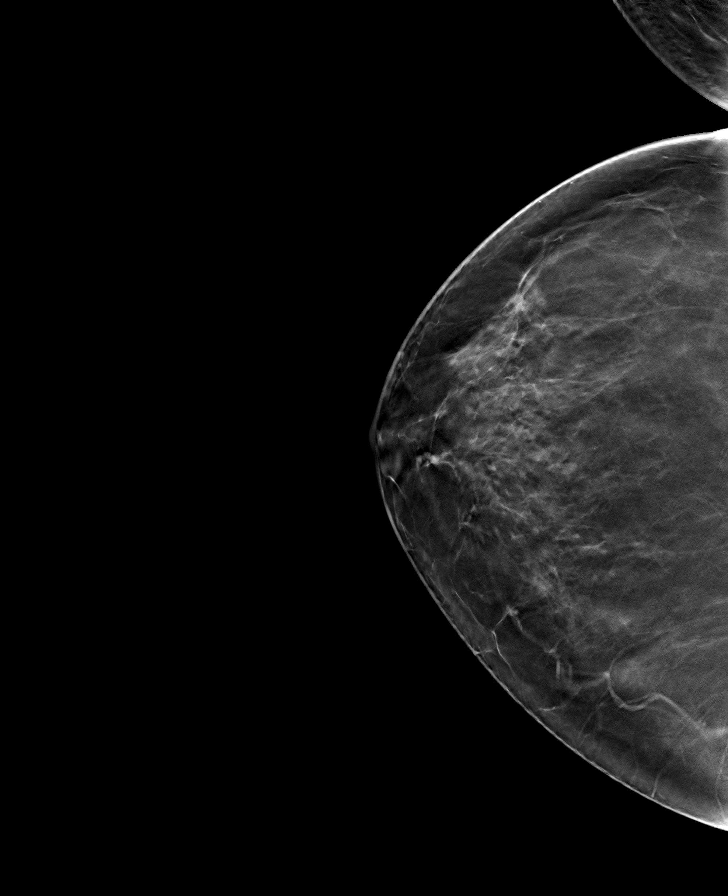

[L MLO tomo · tomo slice 43/84.0]
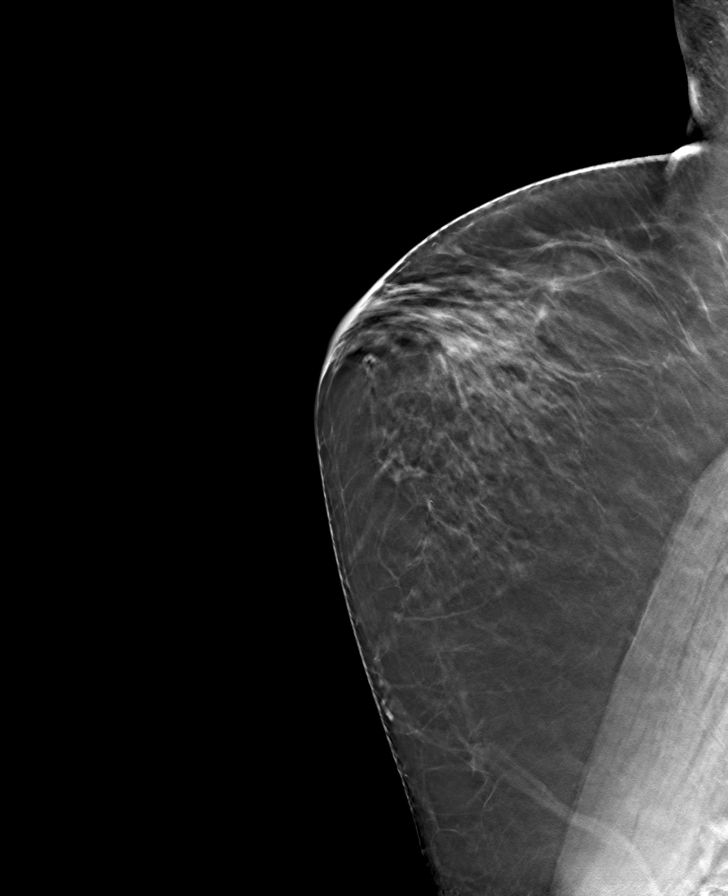

[8 of 24 positions shown; findings below may reference images not displayed]

ACR Breast Density Category b: There are scattered areas of
fibroglandular density.
FINDINGS: There are no findings suspicious for malignancy.
IMPRESSION: No mammographic evidence of malignancy. A result letter of this
screening mammogram will be mailed directly to the patient.

RECOMMENDATION:
Screening mammogram in one year. (Code:51-O-LD2)

BI-RADS CATEGORY  1: Negative.

## 2022-04-14 ENCOUNTER — Other Ambulatory Visit: Payer: Self-pay | Admitting: Gastroenterology

## 2022-04-14 DIAGNOSIS — R1011 Right upper quadrant pain: Secondary | ICD-10-CM

## 2022-04-14 DIAGNOSIS — K219 Gastro-esophageal reflux disease without esophagitis: Secondary | ICD-10-CM

## 2022-04-30 ENCOUNTER — Encounter: Payer: Self-pay | Admitting: *Deleted

## 2022-05-01 ENCOUNTER — Encounter
Admission: RE | Disposition: A | Discharge status: Home / Self Care | Payer: Self-pay | Source: Home / Self Care | Attending: Gastroenterology

## 2022-05-01 ENCOUNTER — Encounter: Payer: Self-pay | Admitting: *Deleted

## 2022-05-01 ENCOUNTER — Ambulatory Visit
Admission: RE | Admit: 2022-05-01 | Discharge: 2022-05-01 | Disposition: A | Discharge status: Home / Self Care | Payer: Commercial Managed Care - HMO | Attending: Gastroenterology | Admitting: Gastroenterology

## 2022-05-01 ENCOUNTER — Ambulatory Visit: Payer: Commercial Managed Care - HMO | Admitting: Certified Registered Nurse Anesthetist

## 2022-05-01 DIAGNOSIS — E039 Hypothyroidism, unspecified: Secondary | ICD-10-CM | POA: Insufficient documentation

## 2022-05-01 DIAGNOSIS — I10 Essential (primary) hypertension: Secondary | ICD-10-CM | POA: Diagnosis not present

## 2022-05-01 DIAGNOSIS — K2950 Unspecified chronic gastritis without bleeding: Secondary | ICD-10-CM | POA: Diagnosis not present

## 2022-05-01 DIAGNOSIS — K648 Other hemorrhoids: Secondary | ICD-10-CM | POA: Diagnosis not present

## 2022-05-01 DIAGNOSIS — E669 Obesity, unspecified: Secondary | ICD-10-CM | POA: Diagnosis not present

## 2022-05-01 DIAGNOSIS — K449 Diaphragmatic hernia without obstruction or gangrene: Secondary | ICD-10-CM | POA: Diagnosis not present

## 2022-05-01 DIAGNOSIS — K5730 Diverticulosis of large intestine without perforation or abscess without bleeding: Secondary | ICD-10-CM | POA: Diagnosis not present

## 2022-05-01 DIAGNOSIS — K640 First degree hemorrhoids: Secondary | ICD-10-CM | POA: Insufficient documentation

## 2022-05-01 DIAGNOSIS — Z1211 Encounter for screening for malignant neoplasm of colon: Secondary | ICD-10-CM | POA: Insufficient documentation

## 2022-05-01 DIAGNOSIS — Z86010 Personal history of colonic polyps: Secondary | ICD-10-CM | POA: Diagnosis not present

## 2022-05-01 DIAGNOSIS — E119 Type 2 diabetes mellitus without complications: Secondary | ICD-10-CM | POA: Insufficient documentation

## 2022-05-01 DIAGNOSIS — K219 Gastro-esophageal reflux disease without esophagitis: Secondary | ICD-10-CM | POA: Diagnosis not present

## 2022-05-01 HISTORY — PX: ESOPHAGOGASTRODUODENOSCOPY (EGD) WITH PROPOFOL: SHX5813

## 2022-05-01 HISTORY — PX: COLONOSCOPY WITH PROPOFOL: SHX5780

## 2022-05-01 HISTORY — DX: Irritable bowel syndrome without diarrhea: K58.9

## 2022-05-01 HISTORY — DX: Anemia, unspecified: D64.9

## 2022-05-01 HISTORY — DX: Hypothyroidism, unspecified: E03.9

## 2022-05-01 HISTORY — DX: Nontoxic goiter, unspecified: E04.9

## 2022-05-01 SURGERY — COLONOSCOPY WITH PROPOFOL
Anesthesia: General

## 2022-05-01 MED ORDER — LIDOCAINE HCL (CARDIAC) PF 100 MG/5ML IV SOSY
PREFILLED_SYRINGE | INTRAVENOUS | Status: DC | PRN
  Administered 2022-05-01: 50 mg via INTRAVENOUS

## 2022-05-01 MED ORDER — DEXMEDETOMIDINE (PRECEDEX) IN NS 20 MCG/5ML (4 MCG/ML) IV SYRINGE
PREFILLED_SYRINGE | INTRAVENOUS | Status: DC | PRN
  Administered 2022-05-01: 4 ug via INTRAVENOUS
  Administered 2022-05-01: 8 ug via INTRAVENOUS

## 2022-05-01 MED ORDER — PROPOFOL 10 MG/ML IV BOLUS
INTRAVENOUS | Status: DC | PRN
  Administered 2022-05-01: 20 mg via INTRAVENOUS
  Administered 2022-05-01: 80 mg via INTRAVENOUS
  Administered 2022-05-01: 30 mg via INTRAVENOUS

## 2022-05-01 MED ORDER — SODIUM CHLORIDE 0.9 % IV SOLN: INTRAVENOUS | Status: DC

## 2022-05-01 MED ORDER — PROPOFOL 500 MG/50ML IV EMUL
INTRAVENOUS | Status: DC | PRN
  Administered 2022-05-01: 150 ug/kg/min via INTRAVENOUS

## 2022-05-01 NOTE — H&P (Signed)
Outpatient short stay form Pre-procedure 05/01/2022  Regis Bill, MD  Primary Physician: Kandyce Rud, MD  Reason for visit:  GERD/RUQ pressure/Surveillance colonoscopy  History of present illness:    61 y/o lady with history of hypothyroidism and hypertension here for EGD/Colonoscopy for upper GI symptoms and surveillance colonoscopy. No blood thinners. No first degree relatives with GI malignancies. No significant abdominal surgeries.    Current Facility-Administered Medications:    0.9 %  sodium chloride infusion, , Intravenous, Continuous, Makahla Kiser, Rossie Muskrat, MD, Last Rate: 20 mL/hr at 05/01/22 1045, Continued from Pre-op at 05/01/22 1045  Medications Prior to Admission  Medication Sig Dispense Refill Last Dose   azelastine (ASTELIN) 0.1 % nasal spray Place into both nostrils 2 (two) times daily. Use in each nostril as directed      estradiol (ESTRACE) 0.1 MG/GM vaginal cream Place 1 Applicatorful vaginally at bedtime.      ipratropium (ATROVENT) 0.06 % nasal spray Place 2 sprays into both nostrils 4 (four) times daily.      levothyroxine (SYNTHROID, LEVOTHROID) 112 MCG tablet Take 112 mcg by mouth daily. One tablet once a day   04/30/2022   losartan (COZAAR) 50 MG tablet Take 50 mg by mouth daily.   04/30/2022   vitamin C (ASCORBIC ACID) 500 MG tablet Take 500 mg by mouth daily.   Past Week   albuterol (PROVENTIL HFA;VENTOLIN HFA) 108 (90 Base) MCG/ACT inhaler Inhale 2 puffs into the lungs every 6 (six) hours as needed for wheezing or shortness of breath. 1 Inhaler 0    beclomethasone (QVAR) 80 MCG/ACT inhaler Inhale 1 puff into the lungs 2 (two) times daily. (Patient not taking: Reported on 05/01/2022) 1 Inhaler 12 Not Taking   ciclopirox (LOPROX) 0.77 % cream Apply topically 2 (two) times daily. 90 g 0    fexofenadine (ALLEGRA ALLERGY) 180 MG tablet Take 180 mg by mouth daily as needed.       glucose blood test strip Use as instructed to check Blood sugars once a day.  E11.9. Please dispense One touch verio test strips 100 each 12    ONE TOUCH LANCETS MISC Use to check Blood sugars once a day. E11.9 Please dispense one touch delica. 200 each 1    triamcinolone (NASACORT AQ) 55 MCG/ACT AERO nasal inhaler Place 2 sprays into the nose daily as needed.        No Known Allergies   Past Medical History:  Diagnosis Date   Allergy    Anemia    Asthma    Chicken pox    Diabetes mellitus without complication (HCC)    GERD (gastroesophageal reflux disease)    Goiter    Heart murmur    Hypertension    Hypothyroidism    Irritable bowel    Migraines    Thyroid disease     Review of systems:  Otherwise negative.    Physical Exam  Gen: Alert, oriented. Appears stated age.  HEENT: PERRLA. Lungs: No respiratory distress CV: RRR Abd: soft, benign, no masses Ext: No edema    Planned procedures: Proceed with EGD/colonoscopy. The patient understands the nature of the planned procedure, indications, risks, alternatives and potential complications including but not limited to bleeding, infection, perforation, damage to internal organs and possible oversedation/side effects from anesthesia. The patient agrees and gives consent to proceed.  Please refer to procedure notes for findings, recommendations and patient disposition/instructions.     Regis Bill, MD [facility]

## 2022-05-01 NOTE — Anesthesia Preprocedure Evaluation (Addendum)
Anesthesia Evaluation  Patient identified by MRN, date of birth, ID band Patient awake    Reviewed: Allergy & Precautions, NPO status , Patient's Chart, lab work & pertinent test results  History of Anesthesia Complications Negative for: history of anesthetic complications  Airway Mallampati: III   Neck ROM: Full    Dental no notable dental hx.    Pulmonary asthma ,    Pulmonary exam normal breath sounds clear to auscultation       Cardiovascular hypertension, Normal cardiovascular exam Rhythm:Regular Rate:Normal  Echo 05/21/21:  NORMAL LEFT VENTRICULAR SYSTOLIC FUNCTION  NORMAL LA PRESSURES WITH NORMAL DIASTOLIC FUNCTION  NORMAL RIGHT VENTRICULAR SYSTOLIC FUNCTION  VALVULAR REGURGITATION: TRIVIAL MR, TRIVIAL PR, TRIVIAL TR  NO VALVULAR STENOSIS   Neuro/Psych  Headaches,    GI/Hepatic GERD  ,  Endo/Other  diabetes, Type 2Hypothyroidism Obesity   Renal/GU negative Renal ROS     Musculoskeletal   Abdominal   Peds  Hematology  (+) Blood dyscrasia, anemia ,   Anesthesia Other Findings   Reproductive/Obstetrics                            Anesthesia Physical Anesthesia Plan  ASA: 2  Anesthesia Plan: General   Post-op Pain Management:    Induction: Intravenous  PONV Risk Score and Plan: 3 and Propofol infusion, TIVA and Treatment may vary due to age or medical condition  Airway Management Planned: Natural Airway  Additional Equipment:   Intra-op Plan:   Post-operative Plan:   Informed Consent: I have reviewed the patients History and Physical, chart, labs and discussed the procedure including the risks, benefits and alternatives for the proposed anesthesia with the patient or authorized representative who has indicated his/her understanding and acceptance.       Plan Discussed with: CRNA  Anesthesia Plan Comments: (LMA/GETA backup discussed.  Patient consented for  risks of anesthesia including but not limited to:  - adverse reactions to medications - damage to eyes, teeth, lips or other oral mucosa - nerve damage due to positioning  - sore throat or hoarseness - damage to heart, brain, nerves, lungs, other parts of body or loss of life  Informed patient about role of CRNA in peri- and intra-operative care.  Patient voiced understanding.)        Anesthesia Quick Evaluation

## 2022-05-01 NOTE — Interval H&P Note (Signed)
History and Physical Interval Note:  05/01/2022 10:55 AM  Robin Munoz  has presented today for surgery, with the diagnosis of GERD,RUQ PAIN,HX ADENOMATOUS POLYP OF COLON.  The various methods of treatment have been discussed with the patient and family. After consideration of risks, benefits and other options for treatment, the patient has consented to  Procedure(s) with comments: COLONOSCOPY WITH PROPOFOL (N/A) - DIET CONTROLLED ESOPHAGOGASTRODUODENOSCOPY (EGD) WITH PROPOFOL (N/A) as a surgical intervention.  The patient's history has been reviewed, patient examined, no change in status, stable for surgery.  I have reviewed the patient's chart and labs.  Questions were answered to the patient's satisfaction.     Regis Bill  Ok to proceed with EGD/Colonoscopy

## 2022-05-01 NOTE — Transfer of Care (Signed)
Immediate Anesthesia Transfer of Care Note  Patient: Robin Munoz  Procedure(s) Performed: COLONOSCOPY WITH PROPOFOL ESOPHAGOGASTRODUODENOSCOPY (EGD) WITH PROPOFOL  Patient Location: PACU  Anesthesia Type:General  Level of Consciousness: awake  Airway & Oxygen Therapy: Patient Spontanous Breathing  Post-op Assessment: Report given to RN and Post -op Vital signs reviewed and stable  Post vital signs: Reviewed and stable  Last Vitals:  Vitals Value Taken Time  BP 103/65 05/01/22 1129  Temp    Pulse 60 05/01/22 1129  Resp 20 05/01/22 1129  SpO2 95 % 05/01/22 1129  Vitals shown include unvalidated device data.  Last Pain:  Vitals:   05/01/22 1020  TempSrc: Temporal  PainSc: 0-No pain         Complications: No notable events documented.

## 2022-05-01 NOTE — Op Note (Signed)
[facility] Gastroenterology Patient Name: Robin Munoz Procedure Date: 05/01/2022 10:45 AM MRN: [id number] Account #: [id number] Date of Birth: 1960-11-26 Admit Type: Outpatient Age: 61 Room: [facility] ENDO ROOM 3 Gender: Female Note Status: Finalized Instrument Name: Upper Endoscope [id number] [id number] Procedure:             Upper GI endoscopy Indications:           Abdominal pain in the right upper quadrant,                         Gastro-esophageal reflux disease Providers:             Andrey Farmer MD, MD Referring MD:          Caprice Renshaw MD (Referring MD) Medicines:             Monitored Anesthesia Care Complications:         No immediate complications. Procedure:             Pre-Anesthesia Assessment:                        - Prior to the procedure, a History and Physical was                         performed, and patient medications and allergies were                         reviewed. The patient is competent. The risks and                         benefits of the procedure and the sedation options and                         risks were discussed with the patient. All questions                         were answered and informed consent was obtained.                         Patient identification and proposed procedure were                         verified by the physician, the nurse, the                         anesthesiologist, the anesthetist and the technician                         in the endoscopy suite. Mental Status Examination:                         alert and oriented. Airway Examination: normal                         oropharyngeal airway and neck mobility. Respiratory                         Examination: clear to auscultation. CV Examination:  normal. Prophylactic Antibiotics: The patient does not                         require prophylactic antibiotics. Prior                         Anticoagulants: The patient has taken no  previous                         anticoagulant or antiplatelet agents. ASA Grade                         Assessment: II - A patient with mild systemic disease.                         After reviewing the risks and benefits, the patient                         was deemed in satisfactory condition to undergo the                         procedure. The anesthesia plan was to use monitored                         anesthesia care (MAC). Immediately prior to                         administration of medications, the patient was                         re-assessed for adequacy to receive sedatives. The                         heart rate, respiratory rate, oxygen saturations,                         blood pressure, adequacy of pulmonary ventilation, and                         response to care were monitored throughout the                         procedure. The physical status of the patient was                         re-assessed after the procedure.                        After obtaining informed consent, the endoscope was                         passed under direct vision. Throughout the procedure,                         the patient's blood pressure, pulse, and oxygen                         saturations were monitored continuously. The Endoscope  was introduced through the mouth, and advanced to the                         second part of duodenum. The upper GI endoscopy was                         accomplished without difficulty. The patient tolerated                         the procedure well. Findings:      A small hiatal hernia was present.      The exam of the esophagus was otherwise normal.      The entire examined stomach was normal. Biopsies were taken with a cold       forceps for Helicobacter pylori testing. Estimated blood loss was       minimal.      The examined duodenum was normal. Impression:            - Small hiatal hernia.                        -  Normal stomach. Biopsied.                        - Normal examined duodenum. Recommendation:        - Await pathology results.                        - Perform a colonoscopy today. Procedure Code(s):     --- Professional ---                        [phone], Esophagogastroduodenoscopy, flexible,                         transoral; with biopsy, single or multiple Diagnosis Code(s):     --- Professional ---                        K44.9, Diaphragmatic hernia without obstruction or                         gangrene                        R10.11, Right upper quadrant pain                        K21.9, Gastro-esophageal reflux disease without                         esophagitis CPT copyright 2019 [employer]. All rights reserved. The codes documented in this report are preliminary and upon coder review may  be revised to meet current compliance requirements. Andrey Farmer MD, MD 05/01/2022 11:27:11 AM Number of Addenda: 0 Note Initiated On: 05/01/2022 10:45 AM Estimated Blood Loss:  Estimated blood loss was minimal.      [facility]

## 2022-05-01 NOTE — Anesthesia Postprocedure Evaluation (Signed)
Anesthesia Post Note  Patient: Robin Munoz  Procedure(s) Performed: COLONOSCOPY WITH PROPOFOL ESOPHAGOGASTRODUODENOSCOPY (EGD) WITH PROPOFOL  Patient location during evaluation: PACU Anesthesia Type: General Level of consciousness: awake and alert, oriented and patient cooperative Pain management: pain level controlled Vital Signs Assessment: post-procedure vital signs reviewed and stable Respiratory status: spontaneous breathing, nonlabored ventilation and respiratory function stable Cardiovascular status: blood pressure returned to baseline and stable Postop Assessment: adequate PO intake Anesthetic complications: no   No notable events documented.   Last Vitals:  Vitals:   05/01/22 1150 05/01/22 1154  BP: 121/74 121/74  Pulse: (!) 58 60  Resp: 17 16  Temp:    SpO2: 100% 99%    Last Pain:  Vitals:   05/01/22 1020  TempSrc: Temporal  PainSc: 0-No pain                 Reed Breech

## 2022-05-01 NOTE — Anesthesia Procedure Notes (Signed)
Date/Time: 05/01/2022 10:59 AM  Performed by: Ginger Carne, CRNAPre-anesthesia Checklist: Patient identified, Emergency Drugs available, Suction available, Patient being monitored and Timeout performed Patient Re-evaluated:Patient Re-evaluated prior to induction Oxygen Delivery Method: Nasal cannula Preoxygenation: Pre-oxygenation with 100% oxygen Induction Type: IV induction

## 2022-05-01 NOTE — Op Note (Signed)
[facility] Gastroenterology Patient Name: Robin Munoz Procedure Date: 05/01/2022 10:44 AM MRN: [id number] Account #: [id number] Date of Birth: [birth year] Admit Type: Outpatient Age: 61 Room: [facility] ENDO ROOM 3 Gender: Female Note Status: Finalized Instrument Name: Jasper Riling [id number] Procedure:             Colonoscopy Indications:           Surveillance: Personal history of adenomatous polyps                         on last colonoscopy 5 years ago Providers:             Andrey Farmer MD, MD Referring MD:          Caprice Renshaw MD (Referring MD) Medicines:             Monitored Anesthesia Care Complications:         No immediate complications. Procedure:             Pre-Anesthesia Assessment:                        - Prior to the procedure, a History and Physical was                         performed, and patient medications and allergies were                         reviewed. The patient is competent. The risks and                         benefits of the procedure and the sedation options and                         risks were discussed with the patient. All questions                         were answered and informed consent was obtained.                         Patient identification and proposed procedure were                         verified by the physician, the nurse, the                         anesthesiologist, the anesthetist and the technician                         in the endoscopy suite. Mental Status Examination:                         alert and oriented. Airway Examination: normal                         oropharyngeal airway and neck mobility. Respiratory                         Examination: clear to auscultation. CV Examination:  normal. Prophylactic Antibiotics: The patient does not                         require prophylactic antibiotics. Prior                         Anticoagulants: The patient has taken no  previous                         anticoagulant or antiplatelet agents. ASA Grade                         Assessment: II - A patient with mild systemic disease.                         After reviewing the risks and benefits, the patient                         was deemed in satisfactory condition to undergo the                         procedure. The anesthesia plan was to use monitored                         anesthesia care (MAC). Immediately prior to                         administration of medications, the patient was                         re-assessed for adequacy to receive sedatives. The                         heart rate, respiratory rate, oxygen saturations,                         blood pressure, adequacy of pulmonary ventilation, and                         response to care were monitored throughout the                         procedure. The physical status of the patient was                         re-assessed after the procedure.                        After obtaining informed consent, the colonoscope was                         passed under direct vision. Throughout the procedure,                         the patient's blood pressure, pulse, and oxygen                         saturations were monitored continuously. The  Colonoscope was introduced through the anus and                         advanced to the the cecum, identified by appendiceal                         orifice and ileocecal valve. The colonoscopy was                         somewhat difficult due to significant looping.                         Successful completion of the procedure was aided by                         applying abdominal pressure. The patient tolerated the                         procedure well. The quality of the bowel preparation                         was good. Findings:      The perianal and digital rectal examinations were normal.      A few small-mouthed diverticula  were found in the sigmoid colon.      Internal hemorrhoids were found during retroflexion. The hemorrhoids       were Grade I (internal hemorrhoids that do not prolapse).      The exam was otherwise without abnormality on direct and retroflexion       views. Impression:            - Diverticulosis in the sigmoid colon.                        - Internal hemorrhoids.                        - The examination was otherwise normal on direct and                         retroflexion views.                        - No specimens collected. Recommendation:        - Discharge patient to home.                        - Resume previous diet.                        - Continue present medications.                        - Repeat colonoscopy in 10 years for surveillance.                        - Return to referring physician as previously                         scheduled. Procedure Code(s):     --- Professional ---  G0105, Colorectal cancer screening; colonoscopy on                         individual at high risk Diagnosis Code(s):     --- Professional ---                        Z86.010, Personal history of colonic polyps                        K64.0, First degree hemorrhoids                        K57.30, Diverticulosis of large intestine without                         perforation or abscess without bleeding CPT copyright 2019 [employer]. All rights reserved. The codes documented in this report are preliminary and upon coder review may  be revised to meet current compliance requirements. Andrey Farmer MD, MD 05/01/2022 11:30:25 AM Number of Addenda: 0 Note Initiated On: 05/01/2022 10:44 AM Scope Withdrawal Time: 0 hours 5 minutes 1 second  Total Procedure Duration: 0 hours 14 minutes 10 seconds  Estimated Blood Loss:  Estimated blood loss: none.      [facility]

## 2022-05-04 ENCOUNTER — Encounter: Payer: Self-pay | Admitting: Gastroenterology

## 2022-05-04 LAB — SURGICAL PATHOLOGY

## 2022-08-03 ENCOUNTER — Other Ambulatory Visit: Payer: Self-pay | Admitting: Obstetrics and Gynecology

## 2022-08-03 DIAGNOSIS — Z1231 Encounter for screening mammogram for malignant neoplasm of breast: Secondary | ICD-10-CM

## 2022-09-02 ENCOUNTER — Ambulatory Visit
Admission: RE | Admit: 2022-09-02 | Discharge: 2022-09-02 | Disposition: A | Discharge status: Home / Self Care | Payer: Commercial Managed Care - HMO | Source: Ambulatory Visit | Attending: Obstetrics and Gynecology | Admitting: Obstetrics and Gynecology

## 2022-09-02 DIAGNOSIS — Z1231 Encounter for screening mammogram for malignant neoplasm of breast: Secondary | ICD-10-CM | POA: Diagnosis present

## 2022-10-21 DIAGNOSIS — B354 Tinea corporis: Secondary | ICD-10-CM | POA: Diagnosis not present

## 2022-10-21 DIAGNOSIS — L98411 Non-pressure chronic ulcer of buttock limited to breakdown of skin: Secondary | ICD-10-CM | POA: Diagnosis not present

## 2022-10-21 DIAGNOSIS — L0390 Cellulitis, unspecified: Secondary | ICD-10-CM | POA: Diagnosis not present

## 2022-11-13 DIAGNOSIS — B372 Candidiasis of skin and nail: Secondary | ICD-10-CM | POA: Diagnosis not present

## 2022-11-13 DIAGNOSIS — Z6830 Body mass index (BMI) 30.0-30.9, adult: Secondary | ICD-10-CM | POA: Diagnosis not present

## 2022-12-17 DIAGNOSIS — L292 Pruritus vulvae: Secondary | ICD-10-CM | POA: Diagnosis not present

## 2022-12-17 DIAGNOSIS — N898 Other specified noninflammatory disorders of vagina: Secondary | ICD-10-CM | POA: Diagnosis not present

## 2022-12-17 DIAGNOSIS — N9489 Other specified conditions associated with female genital organs and menstrual cycle: Secondary | ICD-10-CM | POA: Diagnosis not present

## 2022-12-17 DIAGNOSIS — N941 Unspecified dyspareunia: Secondary | ICD-10-CM | POA: Diagnosis not present

## 2023-08-17 ENCOUNTER — Other Ambulatory Visit: Payer: Self-pay | Admitting: Obstetrics and Gynecology

## 2023-08-17 DIAGNOSIS — Z1231 Encounter for screening mammogram for malignant neoplasm of breast: Secondary | ICD-10-CM

## 2023-09-09 ENCOUNTER — Ambulatory Visit
Admission: RE | Admit: 2023-09-09 | Discharge: 2023-09-09 | Disposition: A | Discharge status: Home / Self Care | Payer: 59 | Source: Ambulatory Visit | Attending: Obstetrics and Gynecology | Admitting: Obstetrics and Gynecology

## 2023-09-09 DIAGNOSIS — Z1231 Encounter for screening mammogram for malignant neoplasm of breast: Secondary | ICD-10-CM | POA: Diagnosis present

## 2024-02-18 ENCOUNTER — Other Ambulatory Visit: Payer: Self-pay | Admitting: Gastroenterology

## 2024-02-18 DIAGNOSIS — K7689 Other specified diseases of liver: Secondary | ICD-10-CM

## 2024-09-19 ENCOUNTER — Other Ambulatory Visit: Payer: Self-pay | Admitting: Obstetrics and Gynecology

## 2024-09-19 DIAGNOSIS — Z1231 Encounter for screening mammogram for malignant neoplasm of breast: Secondary | ICD-10-CM

## 2024-09-27 ENCOUNTER — Inpatient Hospital Stay
Admission: RE | Admit: 2024-09-27 | Discharge: 2024-09-27 | Attending: Obstetrics and Gynecology | Admitting: Obstetrics and Gynecology

## 2024-09-27 DIAGNOSIS — Z1231 Encounter for screening mammogram for malignant neoplasm of breast: Secondary | ICD-10-CM | POA: Insufficient documentation
